# Patient Record
Sex: Female | Born: 1955 | Race: White | Hispanic: No | Marital: Married | Smoking: Never smoker
Health system: Southern US, Community
[De-identification: ages and names within clinical notes are randomized; demographics above are authoritative.]

## PROBLEM LIST (undated history)

## (undated) DIAGNOSIS — G43909 Migraine, unspecified, not intractable, without status migrainosus: Secondary | ICD-10-CM

## (undated) DIAGNOSIS — K649 Unspecified hemorrhoids: Secondary | ICD-10-CM

## (undated) HISTORY — PX: HEMORROIDECTOMY: SUR656

## (undated) HISTORY — DX: Migraine, unspecified, not intractable, without status migrainosus: G43.909

## (undated) HISTORY — DX: Unspecified hemorrhoids: K64.9

---

## 2009-10-29 ENCOUNTER — Ambulatory Visit (HOSPITAL_COMMUNITY): Admission: RE | Admit: 2009-10-29 | Discharge: 2009-10-29 | Payer: Self-pay | Admitting: Urology

## 2011-08-23 IMAGING — CT CT ABD-PEL WO/W CM
3 of 11 series · 12 of 46 positions shown, 18 images · IV contrast (Omnipaque 300)
Comparison: None.

CLINICAL DATA: Cystitis with UTI.  Hematuria since 09/28/2009.

CT ABDOMEN AND PELVIS WITHOUT AND WITH CONTRAST
TECHNIQUE: Multidetector CT imaging of the abdomen and pelvis was
performed without contrast material in one or both body regions,
followed by contrast material(s) and further sections in one or
both body regions.
Contrast: 125 ml Omnipaque 300%

[Series 3: abd_pel_wo 5.0 b40f · axial · 0.70mm/px · z∈[-396,-336]mm · 2 of 84 slices shown]
[im 12/84  soft-tissue]
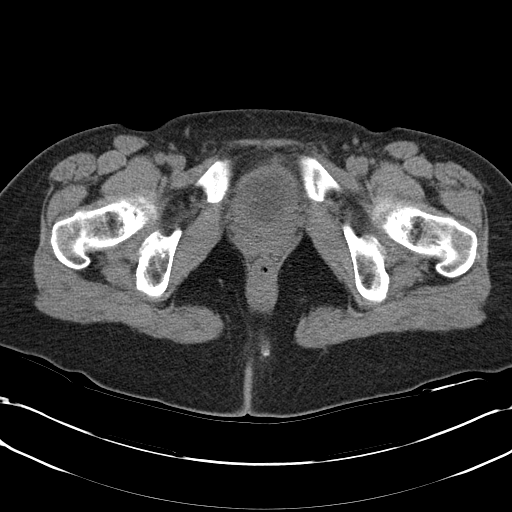
[im 24/84  soft-tissue]
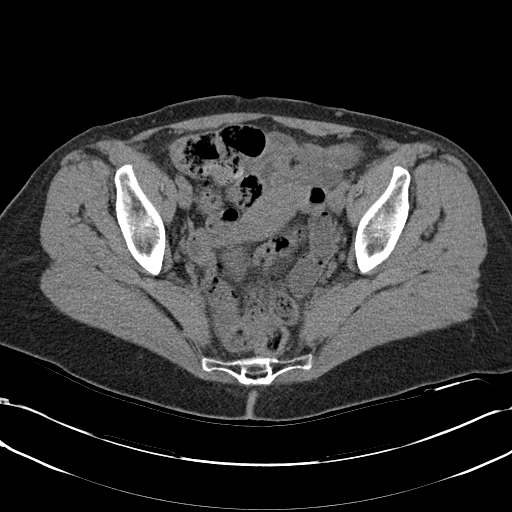

[Series 4: mpr pre contrast coronal 3.0 · coronal · non-contrast · 0.70mm/px · 2 of 71 slices shown, 3 images]
[im 24/71  soft-tissue]
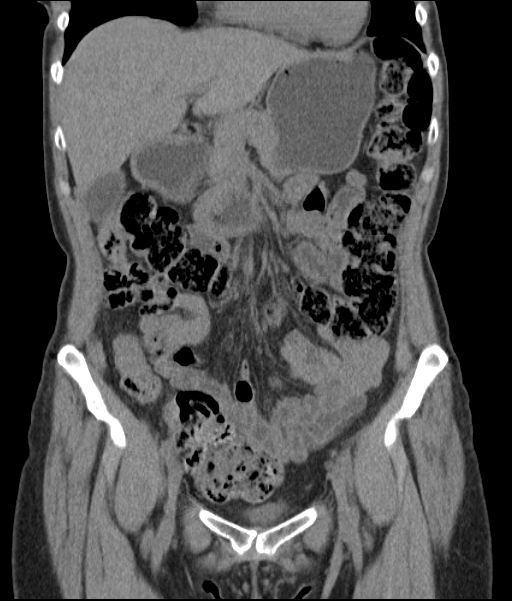
[im 24/71  bone]
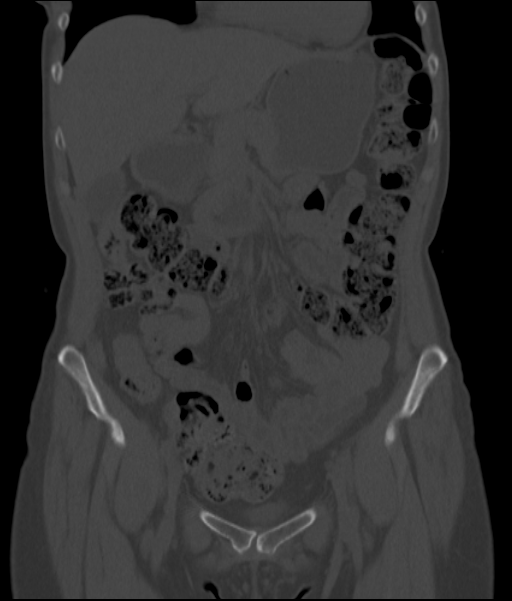
[im 47/71  soft-tissue]
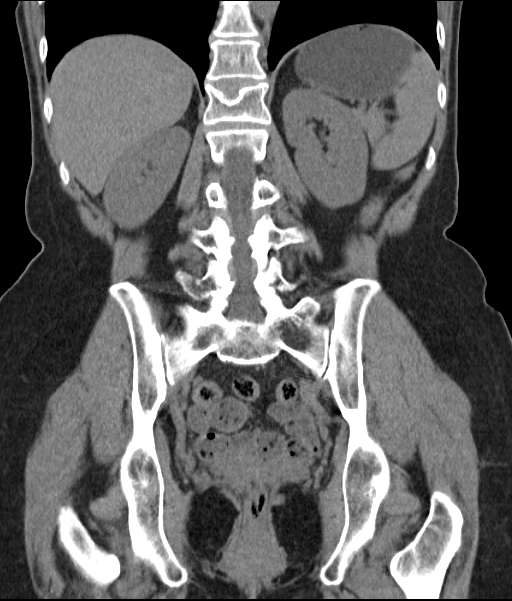

[Series 11: abd_pel 5.0 b40f · axial · 0.69mm/px · z∈[-282,+84]mm · 8 of 95 slices shown, 13 images]
[im 11/95  soft-tissue]
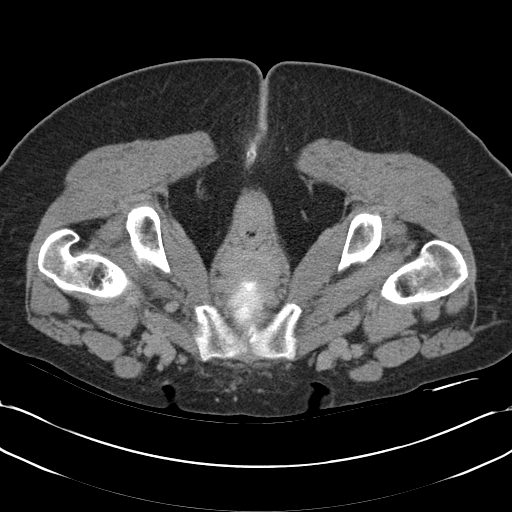
[im 11/95  bone]
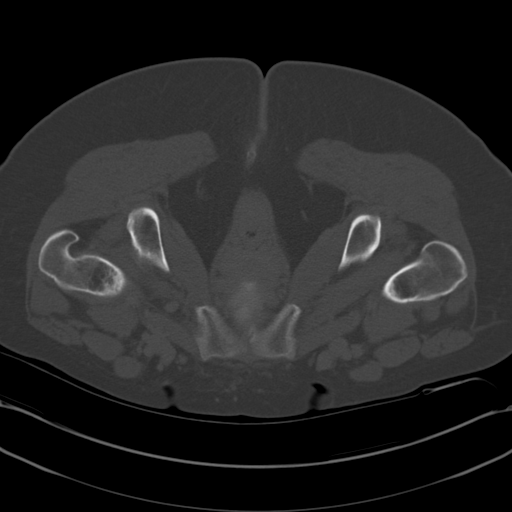
[im 21/95  soft-tissue]
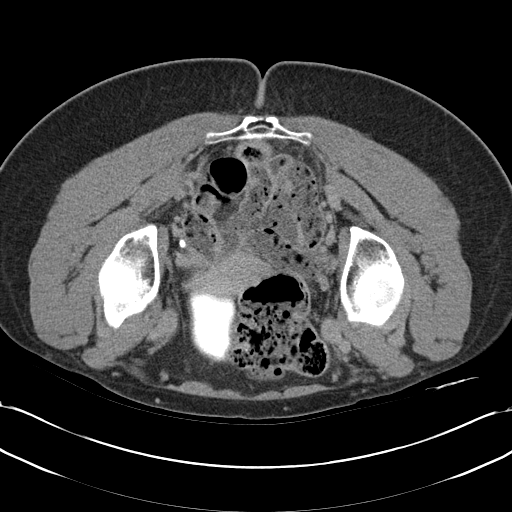
[im 32/95  soft-tissue]
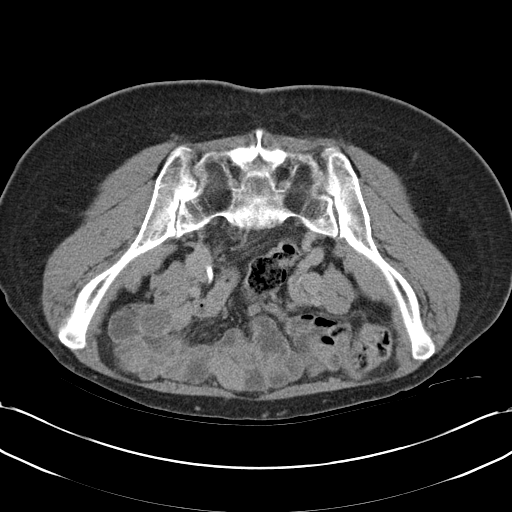
[im 42/95  soft-tissue]
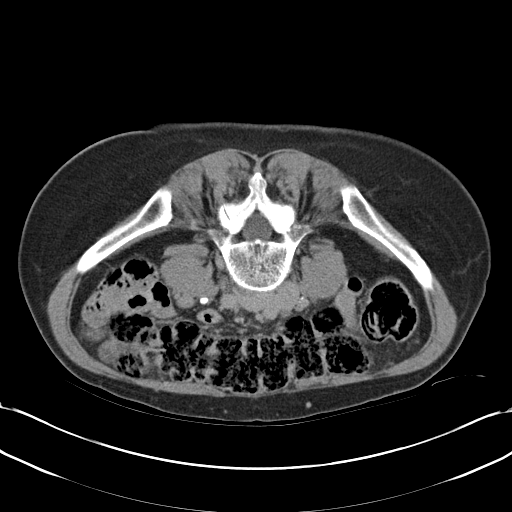
[im 53/95  soft-tissue]
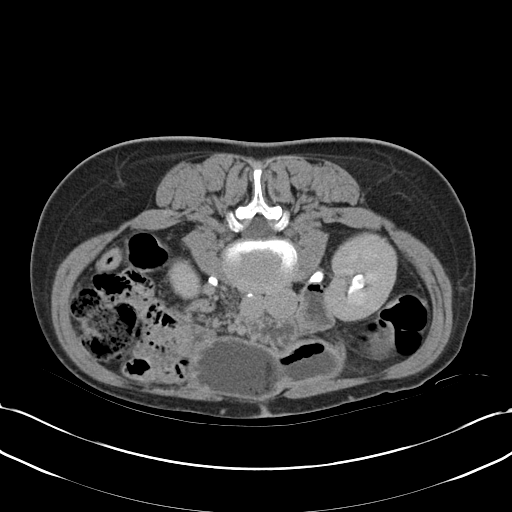
[im 53/95  lung]
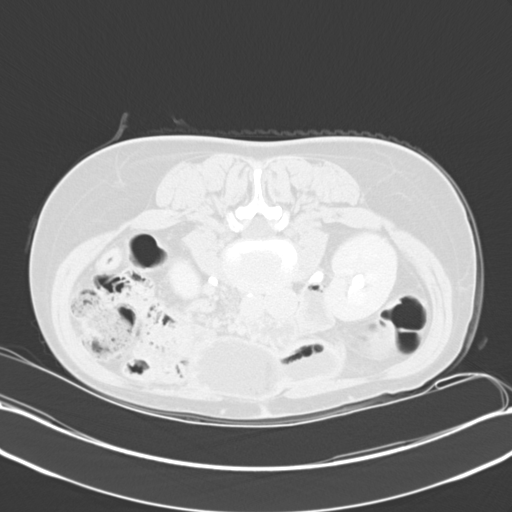
[im 63/95  soft-tissue]
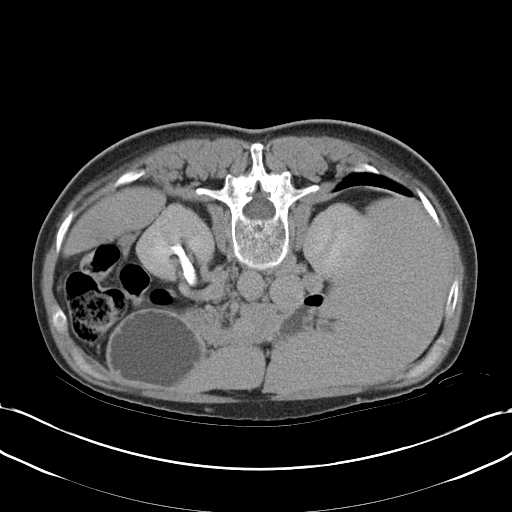
[im 63/95  lung]
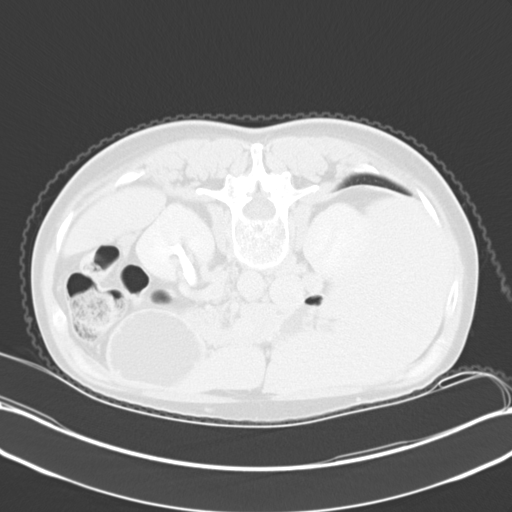
[im 74/95  soft-tissue]
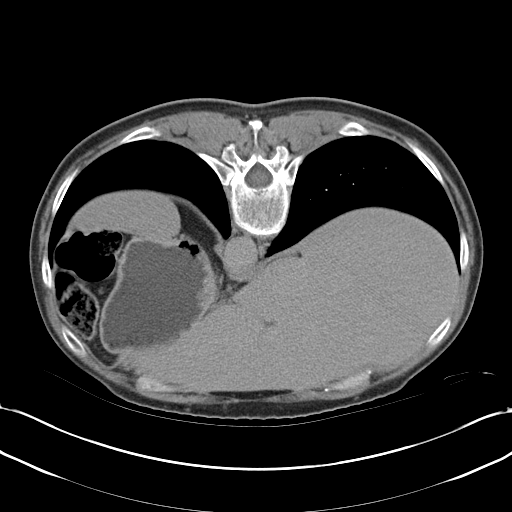
[im 74/95  lung]
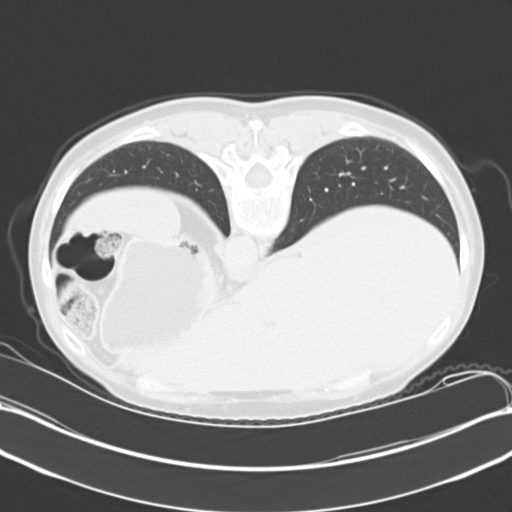
[im 84/95  soft-tissue]
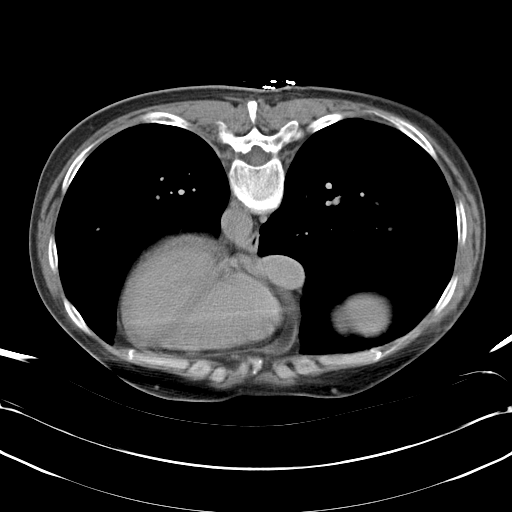
[im 84/95  lung]
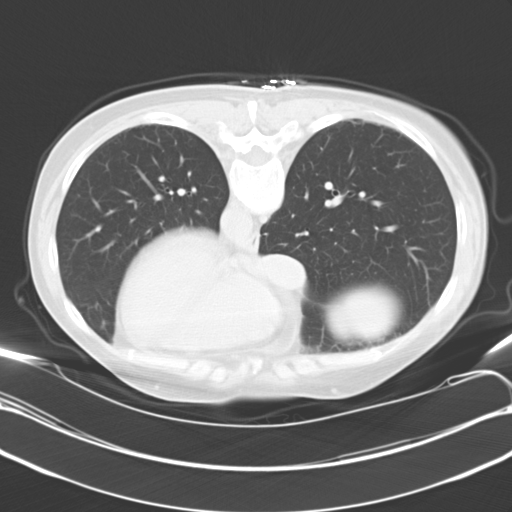

[12 of 46 positions shown; findings below may reference images not displayed]

FINDINGS: The lung bases appear clear.

On the precontrast exam, no evidence for renal, ureteral or bladder
calcifications are seen.  There are two small calcifications
identified in the region of the urethra one measuring 2 mm and the
second measuring approximately 1 mm.  Resolution is not adequate to
assess whether these are intraurethral or periurethral and these
may represent small calcifications in a periurethral diverticuli or
glands.  A small amount of calcification is identified in the
descending aorta and right iliac artery.  Bony structures
demonstrate degenerative changes with degenerative height loss and
Schmorl's node formation at the L1 vertebral body and disc
degeneration at the L5 S1 level. Mild degenerative change of the
right hip is seen.

The liver, spleen, adrenals, pancreas and gallbladder have a normal
post contrasted appearance.  The kidneys demonstrate a normal early
and delayed contrasted appearance.  No focal ureteral or bladder
abnormality is seen.  The bowel has an unremarkable non contrasted
appearance with a normal appearing appendix.

A small amount of free pelvic fluid is suggested.  A normal
postmenopausal appearance to the uterus is seen.  The ovaries are
not visualized with confidence.  No definite abnormal adenopathy is
seen. No intraabdominal or pelvic stranding is seen.  Extra
abdominal soft tissues are unremarkable.
IMPRESSION: No focal renal, ureteral or bladder abnormality noted.  Two small
calcifications noted in the region of the urethra as described
above questionably periurethral.

Bony degenerative changes as described above. Mild atherosclerotic
change.

Otherwise unremarkable CT.

## 2013-07-03 ENCOUNTER — Ambulatory Visit (INDEPENDENT_AMBULATORY_CARE_PROVIDER_SITE_OTHER): Payer: BC Managed Care – PPO | Admitting: Gastroenterology

## 2013-07-03 ENCOUNTER — Encounter (INDEPENDENT_AMBULATORY_CARE_PROVIDER_SITE_OTHER): Payer: Self-pay

## 2013-07-03 ENCOUNTER — Encounter: Payer: Self-pay | Admitting: Gastroenterology

## 2013-07-03 VITALS — BP 156/83 | HR 77 | Temp 98.4°F | Ht 69.0 in | Wt 147.2 lb

## 2013-07-03 DIAGNOSIS — K625 Hemorrhage of anus and rectum: Secondary | ICD-10-CM

## 2013-07-03 NOTE — Progress Notes (Signed)
NO PCP

## 2013-07-03 NOTE — Progress Notes (Signed)
Pt is aware of next banding and appt card was given to her

## 2013-07-03 NOTE — Patient Instructions (Signed)
FOLLOW A HIGH FIBER DIET. AVOID ITEMS THAT CAUSE BLOATING AND GAS. SEE INFO BELOW.  DRINK WATER TO KEEP YOUR URINE LIGHT YELLOW.  CONSIDER USING FIBER POWDER OR 1 PACKET ONCE DAILY FOR 3 DAYS THEN TWICE DAILY FOR 3 DAYS THEN THREE TIMES A DAY. AVOID HIGHER DOSES IF IT CAUSES BLOATING & GAS.   SIT FOR LESS THAN 5 MINUTES ON THE COMMODE.  FOLLOW UP IN JAN 28 AT 1130 AM.    High-Fiber Diet A high-fiber diet changes your normal diet to include more whole grains, legumes, fruits, and vegetables. Changes in the diet involve replacing refined carbohydrates with unrefined foods. The calorie level of the diet is essentially unchanged. The Dietary Reference Intake (recommended amount) for adult males is 38 grams per day. For adult females, it is 25 grams per day. Pregnant and lactating women should consume 28 grams of fiber per day. Fiber is the intact part of a plant that is not broken down during digestion. Functional fiber is fiber that has been isolated from the plant to provide a beneficial effect in the body. PURPOSE  Increase stool bulk.   Ease and regulate bowel movements.   Lower cholesterol.  INDICATIONS THAT YOU NEED MORE FIBER  Constipation and hemorrhoids.   Uncomplicated diverticulosis (intestine condition) and irritable bowel syndrome.   Weight management.   As a protective measure against hardening of the arteries (atherosclerosis), diabetes, and cancer.   DO NOT USE WITH:  Acute diverticulitis (intestine infection).   Partial small bowel obstructions.   Complicated diverticular disease involving bleeding, rupture (perforation), or abscess (boil, furuncle).   Presence of autonomic neuropathy (nerve damage) or gastroparesis (stomach cannot empty itself).    GUIDELINES FOR INCREASING FIBER IN THE DIET  Start adding fiber to the diet slowly. A gradual increase of about 5 more grams (2 slices of whole-wheat bread, 2 servings of most fruits or vegetables, or 1 bowl of  high-fiber cereal) per day is best. Too rapid an increase in fiber may result in constipation, flatulence, and bloating.   Drink enough water and fluids to keep your urine clear or pale yellow. Water, juice, or caffeine-free drinks are recommended. Not drinking enough fluid may cause constipation.   Eat a variety of high-fiber foods rather than one type of fiber.   Try to increase your intake of fiber through using high-fiber foods rather than fiber pills or supplements that contain small amounts of fiber.   The goal is to change the types of food eaten. Do not supplement your present diet with high-fiber foods, but replace foods in your present diet.    INCLUDE A VARIETY OF FIBER SOURCES  Replace refined and processed grains with whole grains, canned fruits with fresh fruits, and incorporate other fiber sources. White rice, white breads, and most bakery goods contain little or no fiber.   Brown whole-grain rice, buckwheat oats, and many fruits and vegetables are all good sources of fiber. These include: broccoli, Brussels sprouts, cabbage, cauliflower, beets, sweet potatoes, white potatoes (skin on), carrots, tomatoes, eggplant, squash, berries, fresh fruits, and dried fruits.   Cereals appear to be the richest source of fiber. Cereal fiber is found in whole grains and bran. Bran is the fiber-rich outer coat of cereal grain, which is largely removed in refining. In whole-grain cereals, the bran remains. In breakfast cereals, the largest amount of fiber is found in those with "bran" in their names. The fiber content is sometimes indicated on the label.   You may need  to include additional fruits and vegetables each day.   In baking, for 1 cup white flour, you may use the following substitutions:   1 cup whole-wheat flour minus 2 tablespoons.   1/2 cup white flour plus 1/2 cup whole-wheat flour.

## 2013-07-03 NOTE — Progress Notes (Signed)
   Subjective:    Patient ID: Erica Jones, female    DOB: 1955-10-12, 58 y.o.   MRN: [id number] No PCP Per Patient  HPI RECTAL BLEEDING FOR THE PAST 1 YEAR. LAST TCS > 10 YEARS. SITS A LOT RIDING IN A CAR. SHE VOLUNTEERS FOR [facility]. HAD 4 KIDS. NO ETOH , ASA, OR BC/GOODY.  Past Medical History  Diagnosis Date  . Hemorrhoids   . Migraines     Past Surgical History  Procedure Laterality Date  . Hemorroidectomy      14 years ago   No Known Allergies  Current Outpatient Prescriptions  Medication Sig Dispense Refill  . ibuprofen (ADVIL,MOTRIN) 200 MG tablet Take 200 mg by mouth every 6 (six) hours as needed.  PRN    . naproxen sodium (ANAPROX) 220 MG tablet Take 220 mg by mouth 3 (three) times daily with meals.  PRN      Family History  Problem Relation Age of Onset  . Colon cancer Neg Hx   . Colon polyps Neg Hx    History   Social History  . Marital Status: Married    Spouse Name: N/A    Number of Children: N/A  . Years of Education: N/A   Occupational History  . Not on file.   Social History Main Topics  . Smoking status: Never Smoker   . Smokeless tobacco: Not on file  . Alcohol Use: No  . Drug Use: No  . Sexual Activity: Not on file   Other Topics Concern  . Not on file   Social History Narrative   PT HAS BEEN IN [place] FOR 2 YEARS. ORIGINALLY FROM [place].              Review of Systems PER HPI OTHERWISE ALL SYSTEMS ARE NEGATIVE.     Objective:   Physical Exam  Vitals reviewed. Constitutional: She is oriented to person, place, and time. She appears well-nourished. No distress.  HENT:  Head: Normocephalic and atraumatic.  Mouth/Throat: No oropharyngeal exudate.  Eyes: Pupils are equal, round, and reactive to light. No scleral icterus.  Neck: Normal range of motion. Neck supple.  Cardiovascular: Normal rate, regular rhythm and normal heart sounds.   Pulmonary/Chest: Effort normal and breath sounds normal. No  respiratory distress.  Abdominal: Soft. Bowel sounds are normal. She exhibits no distension. There is no tenderness.  Musculoskeletal: She exhibits no edema.  VARICOSE VEINS  Lymphadenopathy:    She has no cervical adenopathy.  Neurological: She is alert and oriented to person, place, and time.  NO FOCAL DEFICITS   Psychiatric: She has a normal mood and affect.          Assessment & Plan:

## 2013-07-03 NOTE — Assessment & Plan Note (Addendum)
MOST LIKELY DUE TO HEMORRHOIDS.  DISCUSSED MANAGEMENT OPTIONS: 1. MEDICAL 2. TCS/BANDING OR 3 [facility] BANDING. PT DECLINED TCS AT THIS TIME R ANT BAND PLACED [facility] BANDING JAN 28 AT 1130. NEEDS LIDOCAINE AND NTG PRIOR TO BANDING

## 2013-07-03 NOTE — Progress Notes (Addendum)
  PROCEDURE TECHNIQUE: BENEFITS RISK EXPLAINED TO PT. EXTERNAL HEMORRHOIDS PRESENT. ANOSCOPY PERFORMED. BULGING INTERNAL HEMORRHOID COLUMN IN THE R POSTERIOR, left lateral, AND ANTERIOR COLUMNS. ONE [facility] BAND PLACED IN RIGHT ANTERIOR POSITION. POST-BANDING RECTAL EXAM REVEALED GOOD PLACEMENT. EXAM NON-TENDER.

## 2013-07-23 ENCOUNTER — Ambulatory Visit (INDEPENDENT_AMBULATORY_CARE_PROVIDER_SITE_OTHER): Payer: BC Managed Care – PPO | Admitting: Gastroenterology

## 2013-07-23 ENCOUNTER — Encounter: Payer: Self-pay | Admitting: Gastroenterology

## 2013-07-23 VITALS — BP 153/87 | HR 86 | Temp 97.6°F | Wt 145.2 lb

## 2013-07-23 DIAGNOSIS — K602 Anal fissure, unspecified: Secondary | ICD-10-CM

## 2013-07-23 DIAGNOSIS — K648 Other hemorrhoids: Secondary | ICD-10-CM

## 2013-07-23 MED ORDER — NITROGLYCERIN 0.4 % RE OINT: TOPICAL_OINTMENT | RECTAL | Status: DC

## 2013-07-23 NOTE — Progress Notes (Signed)
SYMPTOMS: RECTAL BLEEDING: sml amount 3 days after [facility] banding.  Continues with RECTAL PAIN.  SYMPTOMS: RECTAL BLEEDING, PRESSURE, PAIN, ITCHING, BURNING.  CONSTIPATION: NO DIARRHEA: NO  STRAINS WITH BMs: NO  TIME SPENT ON TOILET: 10 MINS TISSUE POKES OUT OF RECTUM: YES- GOES BACK BY ITSELF FIBER SUPPLEMENTS: YES-METAMUCIL. GLASSES OF WATER/DAY: 6-8-YES   ADDITIONAL QUESTIONS:  LATEX ALLERGY: NO PREGNANT: NO ERECTILE DYSFUNCTION MEDS OR NITRATES: NO ANTICOAGULATION/ANTIPLATELET MEDS: NO DIAGNOSED WITH CROHN'S DISEASE, PROCTITIS, PORTAL HTN, OR ANAL/RECTAL CA: NO TAKING IMMUNOSUPPRESSANTS/XRT: NO  PLAN: 1. [facility] BANDING TODAY   PROCEDURE TECHNIQUE: BENEFITS RISK EXPLAINED TO PT. ONE [facility] BAND PLACED IN RIGHT ANTERIOR POSITION. POST-BANDING RECTAL EXAM REVEALED GOOD PLACEMENT. EXAM TENDER IN ANTERIOR AND POSTERIOR MIDLINE. BAND SITE NON-TENDER.

## 2013-07-23 NOTE — Patient Instructions (Signed)
FOLLOW A HIGH FIBER DIET. AVOID ITEMS THAT CAUSE BLOATING AND GAS. SEE INFO BELOW.  DRINK WATER TO KEEP HER URINE LIGHT YELLOW.  Use NITROGLYCERIN ointment 0.4% FOUR times a day for 3 MOS.  SIT FOR LESS THAN 5 MINUTES ON THE COMMODE.  FOLLOW UP IN 4 WEEKS.     High-Fiber Diet A high-fiber diet changes your normal diet to include more whole grains, legumes, fruits, and vegetables. Changes in the diet involve replacing refined carbohydrates with unrefined foods. The calorie level of the diet is essentially unchanged. The Dietary Reference Intake (recommended amount) for adult males is 38 grams per day. For adult females, it is 25 grams per day. Pregnant and lactating women should consume 28 grams of fiber per day. Fiber is the intact part of a plant that is not broken down during digestion. Functional fiber is fiber that has been isolated from the plant to provide a beneficial effect in the body. PURPOSE  Increase stool bulk.   Ease and regulate bowel movements.   Lower cholesterol.  INDICATIONS THAT YOU NEED MORE FIBER  Constipation and hemorrhoids.   Uncomplicated diverticulosis (intestine condition) and irritable bowel syndrome.   Weight management.   As a protective measure against hardening of the arteries (atherosclerosis), diabetes, and cancer.   DO NOT USE WITH:  Acute diverticulitis (intestine infection).   Partial small bowel obstructions.   Complicated diverticular disease involving bleeding, rupture (perforation), or abscess (boil, furuncle).   Presence of autonomic neuropathy (nerve damage) or gastroparesis (stomach cannot empty itself).    GUIDELINES FOR INCREASING FIBER IN THE DIET  Start adding fiber to the diet slowly. A gradual increase of about 5 more grams (2 slices of whole-wheat bread, 2 servings of most fruits or vegetables, or 1 bowl of high-fiber cereal) per day is best. Too rapid an increase in fiber may result in constipation, flatulence, and  bloating.   Drink enough water and fluids to keep your urine clear or pale yellow. Water, juice, or caffeine-free drinks are recommended. Not drinking enough fluid may cause constipation.   Eat a variety of high-fiber foods rather than one type of fiber.   Try to increase your intake of fiber through using high-fiber foods rather than fiber pills or supplements that contain small amounts of fiber.   The goal is to change the types of food eaten. Do not supplement your present diet with high-fiber foods, but replace foods in your present diet.    INCLUDE A VARIETY OF FIBER SOURCES  Replace refined and processed grains with whole grains, canned fruits with fresh fruits, and incorporate other fiber sources. White rice, white breads, and most bakery goods contain little or no fiber.   Brown whole-grain rice, buckwheat oats, and many fruits and vegetables are all good sources of fiber. These include: broccoli, Brussels sprouts, cabbage, cauliflower, beets, sweet potatoes, white potatoes (skin on), carrots, tomatoes, eggplant, squash, berries, fresh fruits, and dried fruits.   Cereals appear to be the richest source of fiber. Cereal fiber is found in whole grains and bran. Bran is the fiber-rich outer coat of cereal grain, which is largely removed in refining. In whole-grain cereals, the bran remains. In breakfast cereals, the largest amount of fiber is found in those with "bran" in their names. The fiber content is sometimes indicated on the label.   You may need to include additional fruits and vegetables each day.   In baking, for 1 cup white flour, you may use the following substitutions:  1 cup whole-wheat flour minus 2 tablespoons.   1/2 cup white flour plus 1/2 cup whole-wheat flour.

## 2013-07-24 ENCOUNTER — Encounter: Payer: BC Managed Care – PPO | Admitting: Gastroenterology

## 2013-07-24 DIAGNOSIS — K648 Other hemorrhoids: Secondary | ICD-10-CM | POA: Insufficient documentation

## 2013-07-24 DIAGNOSIS — K602 Anal fissure, unspecified: Secondary | ICD-10-CM | POA: Insufficient documentation

## 2013-07-24 NOTE — Assessment & Plan Note (Signed)
LIKELY CONTRIBUTING TO RECTAL PAIN AND BLEEDING.  FOLLOW A HIGH FIBER DIET. AVOID ITEMS THAT CAUSE BLOATING AND GAS.  DRINK WATER TO KEEP URINE LIGHT YELLOW. Use NITROGLYCERIN ointment 0.4% FOUR times a day for 3 MOS. SIT FOR LESS THAN 5 MINUTES ON THE COMMODE.  FOLLOW UP IN 4 WEEKS.

## 2013-07-24 NOTE — Assessment & Plan Note (Addendum)
PT HAD SIGNIFICANT DISCOMFORT ON RECTAL EXAM  AFTER LIDOCIANE AND NTG OINTMENT APPLIED.  FOLLOW A HIGH FIBER DIET. AVOID ITEMS THAT CAUSE BLOATING AND GAS.  DRINK WATER TO KEEP URINE LIGHT YELLOW. SIT FOR LESS THAN 5 MINUTES ON THE COMMODE.  DISCUSSED MANAGEMENT OPTIONS: 1. BANDING VIA ENDOSCOPY OR CONTINUED [facility] BANDING. PT ELECTED TO FOLLOW UP IN 4 WEEKS FOR L LAT BAND.

## 2013-08-01 ENCOUNTER — Telehealth: Payer: Self-pay | Admitting: Gastroenterology

## 2013-08-01 NOTE — Telephone Encounter (Signed)
I called pt and told her I will check with Dr. Darrick PennaFields and see what we can send in.

## 2013-08-01 NOTE — Telephone Encounter (Signed)
PLEASE CALL PT. SHE HAD A GREAT DEAL OF DISCOMFORT DURING LAST BAND PLACEMENT. IT IS MOST LIKELY DUE TO AN ANAL FISSURE. HER PAIN AND DISCOMFORT WILL CONTINUE IF SHE DOES NOT USE NTG OINTMENT. UNFORTUNATELY THERE ARE NO CHEAPER ALTERNATIVES.   WE WILL CALL [facility] TO SEE IF A COMPOUNDED DOSE WOULD BE CHEAPER: NTG OINTMENT 0.125% 30 G.

## 2013-08-01 NOTE — Telephone Encounter (Signed)
Pt seen recently and SF had prescribed her nitroglycerin ointment. Patient said that it was going to cost her $410 to get and was this something that she really needed. Please advise. [phone]

## 2013-08-01 NOTE — Telephone Encounter (Signed)
[facility]. Instructions : apply a pea sized amt internally two to three times a day. No refills. Per [facility], it will cost the pt about $45.00. Pt is aware.

## 2013-08-21 ENCOUNTER — Ambulatory Visit (INDEPENDENT_AMBULATORY_CARE_PROVIDER_SITE_OTHER): Payer: BC Managed Care – PPO | Admitting: Gastroenterology

## 2013-08-21 ENCOUNTER — Encounter: Payer: Self-pay | Admitting: Gastroenterology

## 2013-08-21 ENCOUNTER — Encounter (INDEPENDENT_AMBULATORY_CARE_PROVIDER_SITE_OTHER): Payer: Self-pay

## 2013-08-21 VITALS — BP 145/83 | HR 80 | Temp 98.4°F | Wt 143.0 lb

## 2013-08-21 DIAGNOSIS — K602 Anal fissure, unspecified: Secondary | ICD-10-CM

## 2013-08-21 DIAGNOSIS — K648 Other hemorrhoids: Secondary | ICD-10-CM

## 2013-08-21 NOTE — Progress Notes (Signed)
   Subjective:    Patient ID: Janace Arisamela Ceesay, female    DOB: 02-20-1956, 58 y.o.   MRN: [id number] No PCP Per Patient\ HPI    Review of Systems     Objective:   Physical Exam  Vitals reviewed. Constitutional: She is oriented to person, place, and time. She appears well-developed and well-nourished. No distress.  HENT:  Head: Normocephalic and atraumatic.  Eyes: Pupils are equal, round, and reactive to light. No scleral icterus.  Cardiovascular: Normal rate, regular rhythm and normal heart sounds.   Pulmonary/Chest: Effort normal and breath sounds normal. No respiratory distress.  Abdominal: Soft. Bowel sounds are normal. She exhibits no distension. There is no tenderness.  Musculoskeletal: She exhibits no edema.  Neurological: She is alert and oriented to person, place, and time.  NO FOCAL DEFICITS           Assessment & Plan:

## 2013-08-21 NOTE — Progress Notes (Signed)
SYMPTOMS: RECTAL BLEEDING STILL AFTER BMs BETTER BUT NOT RESOLVED. JUST STARTED NTG OINTMENT 2 WEEKS AGO. USING TID., PRESSURE, burning slit at anus NO ITCHING. RARE SOILING. AFTER A BM MAY HAVE SOILING.    CONSTIPATION: NO DIARRHEA: NO  STRAINS WITH BMs: NO  TIME SPENT ON TOILET: 5 MINS TISSUE POKES OUT OF RECTUM: NO FIBER SUPPLEMENTS: YES GLASSES OF WATER/DAY: 6-8: YES   ADDITIONAL QUESTIONS:  LATEX ALLERGY: NO PREGNANT: NO ERECTILE DYSFUNCTION MEDS OR NITRATES: NO ANTICOAGULATION/ANTIPLATELET MEDS: NO DIAGNOSED WITH CROHN'S DISEASE, PROCTITIS, PORTAL HTN, OR ANAL/RECTAL CA: NO TAKING IMMUNOSUPPRESSANTS/XRT: NO  PLAN: 1. L LAT BAND TODAY  PROCEDURE TECHNIQUE: BENEFITS RISK EXPLAINED TO PT. NTG/LIDOCAINE APPLIED TO ANUS. ONE [facility] BAND PLACED IN LEFT LATERAL POSITION. POST-BANDING RECTAL EXAM REVEALED GOOD PLACEMENT. EXAM NON-TENDER.

## 2013-08-21 NOTE — Progress Notes (Signed)
No pcp

## 2013-08-21 NOTE — Assessment & Plan Note (Addendum)
SX IMPROVED BUT NOTRESOLVED.  L LAT BAND TODAY OPV IN 6 WEEKS TO ASSESS NEED FOR ANOSCOPY AND [facility] BANDING. WILL NEED NTG/LIDOCAINE PRIOR TO BAND.

## 2013-08-21 NOTE — Assessment & Plan Note (Signed)
SX IMPROVED BUT NOT RESOLVED.  CONTINUE NTG OINTMENT. OPV IN 6 WEEKS

## 2013-08-21 NOTE — Patient Instructions (Signed)
FOLLOW A HIGH FIBER DIET. AVOID ITEMS THAT CAUSE BLOATING AND GAS.   DRINK WATER TO KEEP HER URINE LIGHT YELLOW.   Use NITROGLYCERIN ointment 0.4% FOUR times a day for 3 MOS.   FOLLOW UP IN 6 WEEKS. WE WILL TALK ABOUT YOUR SYMPTOMS AND DECIDE IF YOU NEED ANOTHER BAND.

## 2013-08-23 NOTE — Progress Notes (Signed)
[facility]

## 2013-09-17 ENCOUNTER — Telehealth: Payer: Self-pay

## 2013-09-17 NOTE — Telephone Encounter (Signed)
Pt left a Vm that she needs a refill of her nitroglycerin ointment sent to [facility].

## 2013-09-18 MED ORDER — NITROGLYCERIN 0.4 % RE OINT: TOPICAL_OINTMENT | RECTAL | Status: DC

## 2013-09-18 NOTE — Telephone Encounter (Signed)
Please call pt when her RX is faxed in [phone], pt states her pharmacy does not have it

## 2013-09-18 NOTE — Telephone Encounter (Signed)
PLEASE CALL PT. RX SENT. 

## 2013-09-19 NOTE — Telephone Encounter (Signed)
Pt was aware that Rx was sent in. She questioned why the first was 0.125 % and the last was 0.4 %. She is concerned about it being stronger. She is aware it is for pain but she wants to know if it helps heal from the inside out?  Please advise!

## 2013-09-20 NOTE — Telephone Encounter (Signed)
Called patient TO DISCUSS CONCERNS. LVM-MAY USE NTG OINTMENT AT 0.4. MED SIDE EFFECTS DISCUSSED. I CALLED [facility] First prescription was for 0.125%. REFILL FOR 0.4%.

## 2013-09-23 NOTE — Telephone Encounter (Signed)
SPOKE TO PT. SX NOT SIGNIFICANTLY IMPROVED WITH 0.125%,. INCREASE TO 0.4%. MED SIDE EFFECTS DISCUSSED: HA, DIZZINESS, SYNCOPE. PT VOICED HER UNDERSTANDING.

## 2013-10-09 ENCOUNTER — Ambulatory Visit: Payer: BC Managed Care – PPO | Admitting: Gastroenterology

## 2013-10-16 ENCOUNTER — Ambulatory Visit: Payer: BC Managed Care – PPO | Admitting: Gastroenterology

## 2013-10-23 ENCOUNTER — Encounter (HOSPITAL_COMMUNITY): Payer: Self-pay | Admitting: Pharmacy Technician

## 2013-10-23 ENCOUNTER — Other Ambulatory Visit: Payer: Self-pay | Admitting: Gastroenterology

## 2013-10-23 ENCOUNTER — Ambulatory Visit (INDEPENDENT_AMBULATORY_CARE_PROVIDER_SITE_OTHER): Payer: BC Managed Care – PPO | Admitting: Gastroenterology

## 2013-10-23 ENCOUNTER — Encounter: Payer: Self-pay | Admitting: Gastroenterology

## 2013-10-23 VITALS — BP 144/78 | HR 88 | Temp 97.4°F | Ht 70.0 in | Wt 140.0 lb

## 2013-10-23 DIAGNOSIS — K625 Hemorrhage of anus and rectum: Secondary | ICD-10-CM

## 2013-10-23 DIAGNOSIS — K602 Anal fissure, unspecified: Secondary | ICD-10-CM

## 2013-10-23 DIAGNOSIS — K6289 Other specified diseases of anus and rectum: Secondary | ICD-10-CM

## 2013-10-23 NOTE — Progress Notes (Signed)
   Subjective:    Patient ID: Erica Jones, female    DOB: 06/15/1956, 58 y.o.   MRN: [id number]  No PCP Per Patient  HPI BOTTOM: PAIN BETTER BUT STILL SORE ON THE OUTSIDE. THE BLEEDING CAN BE HEAVY: SAT: A THIN 4. THE STOOL IS OK. SOUNDS LIKE IT'S POURING OUT. STILL USING OINTMENT QID. BLEEDING IS THE SAME. NTG OINTMENT MAKES HER FEEL BAD. DOESN'T FEEL IT'S WORKING AFTER 3 MOS AND INCREASING THE DOSE.   Past Medical History  Diagnosis Date  . Hemorrhoids   . Migraines    Past Surgical History  Procedure Laterality Date  . Hemorroidectomy      14 years ago   No Known Allergies  Current Outpatient Prescriptions  Medication Sig Dispense Refill  . ibuprofen (ADVIL,MOTRIN) 200 MG tablet Take 200 mg by mouth every 6 (six) hours as needed.      . naproxen sodium (ANAPROX) 220 MG tablet Take 220 mg by mouth 3 (three) times daily with meals.      . Nitroglycerin 0.4 % OINT PLACE A SMALL AMOUNT ON Q TIP AND INSERT IN ANAL CANAL QID FOR 3 MOS.       Review of Systems     Objective:   Physical Exam  Vitals reviewed. Constitutional: She is oriented to person, place, and time. She appears well-nourished. No distress.  HENT:  Head: Normocephalic and atraumatic.  Mouth/Throat: Oropharynx is clear and moist. No oropharyngeal exudate.  Eyes: Pupils are equal, round, and reactive to light. No scleral icterus.  Neck: Normal range of motion. Neck supple.  Cardiovascular: Normal rate, regular rhythm and normal heart sounds.   Pulmonary/Chest: Effort normal and breath sounds normal. No respiratory distress.  Abdominal: Soft. Bowel sounds are normal. She exhibits no distension. There is no tenderness.  Musculoskeletal: She exhibits no edema.  Lymphadenopathy:    She has no cervical adenopathy.  Neurological: She is alert and oriented to person, place, and time.  NO FOCAL DEFICITS   Psychiatric:  SLIGHTLY ANXIOUS MOOD, NL AFFECT           Assessment & Plan:

## 2013-10-23 NOTE — Patient Instructions (Addendum)
  FLEXIBLE SIGMOIDSCOPY WITH POSSIBLE HEMORRHOID BANDING MON MAY 4. YOU MAY HAVE A FULL LIQUID BREAKFAST. SEE INFO BELOW. NOTHING TO EAT OR DRINK AFTER 9 AM MAY 4.  STOP USING NITRO-GLYCERIN OINTMENT.  FOLLOW UP IN 3 WEEKS.  Full Liquid Diet A high-calorie, high-protein supplement should be used to meet your nutritional requirements when the full liquid diet is continued for more than 2 or 3 days. If this diet is to be used for an extended period of time (more than 7 days), a multivitamin should be considered.  Breads and Starches  Allowed: None are allowed except crackers WHOLE OR pureed (made into a thick, smooth soup) in soup. Cooked, refined corn, oat, rice, rye, and wheat cereals are also allowed.   Avoid: Any others.    Potatoes/Pasta/Rice  Allowed: ANY ITEM AS A SOUP OR SMALL PLATE OF MASHED POTATOES OR RICE.       Vegetables  Allowed: Strained tomato or vegetable juice. Vegetables pureed in soup.   Avoid: Any others.    Fruit  Allowed: Any strained fruit juices and fruit drinks. Include 1 serving of citrus or vitamin C-enriched fruit juice daily.   Avoid: Any others.  Meat and Meat Substitutes  Allowed: Egg  Avoid: Any meat, fish, or fowl. All cheese.  Milk  Allowed: Milk beverages, including milk shakes and instant breakfast mixes. Smooth yogurt.   Avoid: Any others. Avoid dairy products if not tolerated.    Soups and Combination Foods  Allowed: Broth, strained cream soups. Strained, broth-based soups.   Avoid: Any others.    Desserts and Sweets  Allowed: flavored gelatin, tapioca, plain ice cream, sherbet, smooth pudding, junket, fruit ices, frozen ice pops, pudding pops,, frozen fudge pops, chocolate syrup. Sugar, honey, jelly, syrup.   Avoid: Any others.  Fats and Oils  Allowed: Margarine, butter, cream, sour cream, oils.   Avoid: Any others.  Beverages  Allowed: All.   Avoid: None.  Condiments  Allowed: Iodized salt, pepper, spices,  flavorings. Cocoa powder.   Avoid: Any others.    SAMPLE MEAL PLAN Breakfast   cup orange juice.   1 cup cooked wheat cereal.   1 cup  milk.   1 cup beverage (coffee or tea).   Cream or sugar, if desired.    Midmorning Snack  2 SCRAMBLED OR HARD BOILED EGG   Lunch  1 cup cream soup.    cup fruit juice.   1 cup milk.    cup custard.   1 cup beverage (coffee or tea).   Cream or sugar, if desired.    Midafternoon Snack  1 cup milk shake.  Dinner  1 cup cream soup.    cup fruit juice.   1 cup milk.    cup pudding.   1 cup beverage (coffee or tea).   Cream or sugar, if desired.  Evening Snack  1 cup supplement.  To increase calories, add sugar, cream, butter, or margarine if possible. Nutritional supplements will also increase the total calories.

## 2013-10-23 NOTE — Assessment & Plan Note (Addendum)
SX NOT IMPROVED. DIFFERENTIAL DIAGNOSIS INCLUDES CONTINUED RECTAL BLEEDING FROM HEMORRHOIDS, LESS LIKELY RECTAL CA.  DISCUSSED OPTIONS, BENEFITS V. RISKS OF MANAGING RECTAL BLEEDING/PAIN: 1. TCS, 2. FLEX SIG, OR SURGERY REFERRAL. PT DECLINED TCS. WOULD LIKE TO LEAVE SURGERY AS THE LAST OPTION. FLEX SIG MON WITH POSSIBLE HEMORRHOID BANDING STOP USING NTG OINTMENT OPV IN 3 WEEKS

## 2013-10-28 ENCOUNTER — Ambulatory Visit (HOSPITAL_COMMUNITY)
Admission: RE | Admit: 2013-10-28 | Discharge: 2013-10-28 | Disposition: A | Discharge status: Home / Self Care | Payer: BC Managed Care – PPO | Source: Ambulatory Visit | Attending: Gastroenterology | Admitting: Gastroenterology

## 2013-10-28 ENCOUNTER — Encounter (HOSPITAL_COMMUNITY): Payer: Self-pay | Admitting: *Deleted

## 2013-10-28 ENCOUNTER — Encounter (HOSPITAL_COMMUNITY)
Admission: RE | Disposition: A | Discharge status: Home / Self Care | Payer: Self-pay | Source: Ambulatory Visit | Attending: Gastroenterology

## 2013-10-28 DIAGNOSIS — K6289 Other specified diseases of anus and rectum: Secondary | ICD-10-CM

## 2013-10-28 DIAGNOSIS — K5730 Diverticulosis of large intestine without perforation or abscess without bleeding: Secondary | ICD-10-CM | POA: Insufficient documentation

## 2013-10-28 DIAGNOSIS — K625 Hemorrhage of anus and rectum: Secondary | ICD-10-CM

## 2013-10-28 DIAGNOSIS — K922 Gastrointestinal hemorrhage, unspecified: Secondary | ICD-10-CM | POA: Insufficient documentation

## 2013-10-28 DIAGNOSIS — K648 Other hemorrhoids: Secondary | ICD-10-CM

## 2013-10-28 HISTORY — PX: HEMORRHOID BANDING: SHX5850

## 2013-10-28 HISTORY — PX: FLEXIBLE SIGMOIDOSCOPY: SHX5431

## 2013-10-28 SURGERY — SIGMOIDOSCOPY, FLEXIBLE
Anesthesia: Moderate Sedation

## 2013-10-28 MED ORDER — MIDAZOLAM HCL 5 MG/5ML IJ SOLN
INTRAMUSCULAR | Status: DC | PRN
  Administered 2013-10-28 (×3): 2 mg via INTRAVENOUS

## 2013-10-28 MED ORDER — SIMETHICONE 40 MG/0.6ML PO SUSP
ORAL | Status: DC | PRN
  Administered 2013-10-28: 14:00:00

## 2013-10-28 MED ORDER — LIDOCAINE HCL 2 % EX GEL
Freq: Once | CUTANEOUS | Status: AC
  Administered 2013-10-28: 1 via TOPICAL
  Filled 2013-10-28: qty 5

## 2013-10-28 MED ORDER — MEPERIDINE HCL 100 MG/ML IJ SOLN
INTRAMUSCULAR | Status: AC
  Filled 2013-10-28: qty 2

## 2013-10-28 MED ORDER — SODIUM CHLORIDE 0.9 % IJ SOLN
INTRAMUSCULAR | Status: AC
  Filled 2013-10-28: qty 10

## 2013-10-28 MED ORDER — SODIUM CHLORIDE 0.9 % IV SOLN
INTRAVENOUS | Status: DC
  Administered 2013-10-28: 14:00:00 via INTRAVENOUS

## 2013-10-28 MED ORDER — PROMETHAZINE HCL 25 MG/ML IJ SOLN
INTRAMUSCULAR | Status: AC
  Filled 2013-10-28: qty 1

## 2013-10-28 MED ORDER — MIDAZOLAM HCL 5 MG/5ML IJ SOLN
INTRAMUSCULAR | Status: AC
  Filled 2013-10-28: qty 10

## 2013-10-28 MED ORDER — MEPERIDINE HCL 100 MG/ML IJ SOLN
INTRAMUSCULAR | Status: DC | PRN
  Administered 2013-10-28 (×3): 25 mg via INTRAVENOUS

## 2013-10-28 MED ORDER — LIDOCAINE HCL 2 % EX GEL
CUTANEOUS | Status: AC
  Filled 2013-10-28: qty 30

## 2013-10-28 MED ORDER — PROMETHAZINE HCL 25 MG/ML IJ SOLN
INTRAMUSCULAR | Status: DC | PRN
  Administered 2013-10-28: 6.25 mg via INTRAVENOUS

## 2013-10-28 MED ORDER — PROMETHAZINE HCL 25 MG/ML IJ SOLN
6.2500 mg | Freq: Once | INTRAMUSCULAR | Status: AC
  Administered 2013-10-28: 6.25 mg via INTRAVENOUS

## 2013-10-28 NOTE — Op Note (Signed)
[facility] [address] [place], [id number]   FLEX SIGMOIDOSCOPY PROCEDURE REPORT  PATIENT: Erica Jones, Erica R.  MR#: [id number] BIRTHDATE: Apr 18, 1956 , 58  yrs. old GENDER: Female ENDOSCOPIST: Jonette EvaSandi Kayven Aldaco, MD REFERRED BY: PROCEDURE DATE:  10/28/2013 PROCEDURE:   Sigmoidoscopy, screening and Hemorrhoidectomy via banding(3) INDICATIONS:rectal bleeding. MEDICATIONS: Demerol 75 mg IV, Versed 6 mg IV, and Promethazine (Phenergan) 12.5mg  IV  DESCRIPTION OF PROCEDURE:    Physical exam was performed.  Informed consent was obtained from the patient after explaining the benefits, risks, and alternatives to procedure.  The patient was connected to monitor and placed in left lateral position. Continuous oxygen was provided by nasal cannula and IV medicine administered through an indwelling cannula.  After administration of sedation and rectal exam, the patients rectum was intubated and the EG-[id number] ([id number]  colonoscope was advanced under direct visualization to the DESCENDING COLON  The scope was removed slowly by carefully examining the color, texture, anatomy, and integrity mucosa on the way out.  The patient was recovered in endoscopy and discharged home in satisfactory condition.    COLON FINDINGS: Mild diverticulosis was noted in the sigmoid colon. PRIOR BANDING SITES NOTED IN THE RECTUM. , The colon mucosa was otherwise normal.  Large internal hemorrhoids were found.    3 BANDS APPLIED.  PREP QUALITY: adequate      COMPLICATIONS: None  ENDOSCOPIC IMPRESSION: 1.   Mild diverticulosis was noted in the sigmoid colon 2.   The colon mucosa was otherwise normal 3.   Large internal hemorrhoids  RECOMMENDATIONS: 1.  YOU may use ibuprofen 200 MG over-the-counter 2 every 6 hours as needed for mild rectal pain OR TYLENOL.   Take the ibuprofen with food and milk. 2  Use sitz bath 3 times a day AS NEEDED FOR RECTAL PAIN. 3.  Avoid straining to have bowel  movements. 4.  Keep anal area dry and clean. 5.  Do not use a donut shaped pillow or sit on the toilet for long periods.  This increases blood pooling and pain. 6.  Move your bowels when your body has the urge; this will require less straining and will decrease pain and pressure. 7.  IF NEEDED, add Colace 100 mg twice daily FOR 7 DAYS to soften the stool.  HOLD FOR DIARRHEA. 8.  FOLLOW A LOW RESIDUE DIET FOR 2 WEEKS.  SEE INFO BELOW. 9.  USE NITROGLYCERIN AS NEEDED TO RELIEF RECTAL PRESSURE/THROBBING. 10. FLEX SIG IN 5 YEARS FOR COLON CANCER SCREENING       _______________________________ Rosalie DoctoreSignedJonette Eva:  Joleen Stuckert, MD 10/28/2013 3:21 PM     PATIENT NAME:  Erica Jones, Erica R. MR#: [id number]

## 2013-10-28 NOTE — H&P (View-Only) (Signed)
   Subjective:    Patient ID: Erica Jones, female    DOB: 07/18/1955, 58 y.o.   MRN: [id number]  No PCP Per Patient  HPI BOTTOM: PAIN BETTER BUT STILL SORE ON THE OUTSIDE. THE BLEEDING CAN BE HEAVY: SAT: A THIN 4. THE STOOL IS OK. SOUNDS LIKE IT'S POURING OUT. STILL USING OINTMENT QID. BLEEDING IS THE SAME. NTG OINTMENT MAKES HER FEEL BAD. DOESN'T FEEL IT'S WORKING AFTER 3 MOS AND INCREASING THE DOSE.   Past Medical History  Diagnosis Date  . Hemorrhoids   . Migraines    Past Surgical History  Procedure Laterality Date  . Hemorroidectomy      14 years ago   No Known Allergies  Current Outpatient Prescriptions  Medication Sig Dispense Refill  . ibuprofen (ADVIL,MOTRIN) 200 MG tablet Take 200 mg by mouth every 6 (six) hours as needed.      . naproxen sodium (ANAPROX) 220 MG tablet Take 220 mg by mouth 3 (three) times daily with meals.      . Nitroglycerin 0.4 % OINT PLACE A SMALL AMOUNT ON Q TIP AND INSERT IN ANAL CANAL QID FOR 3 MOS.       Review of Systems     Objective:   Physical Exam  Vitals reviewed. Constitutional: She is oriented to person, place, and time. She appears well-nourished. No distress.  HENT:  Head: Normocephalic and atraumatic.  Mouth/Throat: Oropharynx is clear and moist. No oropharyngeal exudate.  Eyes: Pupils are equal, round, and reactive to light. No scleral icterus.  Neck: Normal range of motion. Neck supple.  Cardiovascular: Normal rate, regular rhythm and normal heart sounds.   Pulmonary/Chest: Effort normal and breath sounds normal. No respiratory distress.  Abdominal: Soft. Bowel sounds are normal. She exhibits no distension. There is no tenderness.  Musculoskeletal: She exhibits no edema.  Lymphadenopathy:    She has no cervical adenopathy.  Neurological: She is alert and oriented to person, place, and time.  NO FOCAL DEFICITS   Psychiatric:  SLIGHTLY ANXIOUS MOOD, NL AFFECT           Assessment & Plan:

## 2013-10-28 NOTE — Interval H&P Note (Signed)
History and Physical Interval Note:  10/28/2013 2:10 PM  Erica Jones  has presented today for surgery, with the diagnosis of RECTAL BLEEDING AND RECTAL PAIN  The various methods of treatment have been discussed with the patient and family. After consideration of risks, benefits and other options for treatment, the patient has consented to  Procedure(s) with comments: FLEXIBLE SIGMOIDOSCOPY (N/A) - 2:30 HEMORRHOID BANDING (N/A) as a surgical intervention .  The patient's history has been reviewed, patient examined, no change in status, stable for surgery.  I have reviewed the patient's chart and labs.  Questions were answered to the patient's satisfaction.     Sandi L Fields

## 2013-10-28 NOTE — Discharge Instructions (Signed)
HEMORRHOIDAL BANDING DISCHARGE INSTRUCTIONS  I PUT BANDS IN 3 AREAS ABOVE YOUR HEMORRHOIDS. CALL [phone] FOR FOR RECTAL BLEEDING, FEVER, PAIN, OR DIFFICULTY URINATING. YOU MAY SEE BLEEDING FOR 3 DAYS TO 2 WEEKS.    HOME CARE INSTRUCTIONS  FOLLOWING YOUR PROCEDURE IT IS COMMON TO HAVE MINOR RECTAL DISCOMFORT OR PAIN. 1.  YOU may use ibuprofen 200 MG over-the-counter 2 every 6 hours as needed for mild rectal pain OR TYLENOL.   Take the ibuprofen with food and milk.  2   Use sitz bath 3 times a day AS NEEDED FOR RECTAL PAIN.  3. Avoid straining to have bowel movements. 4. Keep anal area dry and clean.  5. Do not use a donut shaped pillow or sit on the toilet for long periods. This increases blood pooling and pain.  6. Move your bowels when your body has the urge; this will require less straining and will decrease pain and pressure.  7. IF NEEDED, add Colace 100 mg twice daily FOR 7 DAYS to soften the stool. HOLD FOR DIARRHEA. 8. FOLLOW A LOW RESIDUE DIET FOR 2 WEEKS. SEE INFO BELOW. 9. USE NITROGLYCERIN ointment or LIDOCAINE 2% AS NEEDED TO RELIEF RECTAL PRESSURE/THROBBING. 10. FLEX SIG IN 5 YEARS FOR COLON CANCER SCREENING.      SIGMOIDOSCOPY Care After Read the instructions outlined below and refer to this sheet in the next week. These discharge instructions provide you with general information on caring for yourself after you leave the hospital. While your treatment has been planned according to the most current medical practices available, unavoidable complications occasionally occur. If you have any problems or questions after discharge, call DR. Jairus Tonne, [phone].  ACTIVITY  You may resume your regular activity, but move at a slower pace for the next 24 hours.   Take frequent rest periods for the next 24 hours.   Walking will help get rid of the air and reduce the bloated feeling in your belly (abdomen).   No driving for 24 hours (because of the medicine (anesthesia)  used during the test).   You may shower.   Do not sign any important legal documents or operate any machinery for 24 hours (because of the anesthesia used during the test).    NUTRITION  Drink plenty of fluids.   You may resume your normal diet as instructed by your doctor.   Begin with a light meal and progress to your normal diet. Heavy or fried foods are harder to digest and may make you feel sick to your stomach (nauseated).   Avoid alcoholic beverages for 24 hours or as instructed.    MEDICATIONS  You may resume your normal medications.   WHAT YOU CAN EXPECT TODAY  Some feelings of bloating in the abdomen.   Passage of more gas than usual.   Spotting of blood in your stool or on the toilet paper  .  IF YOU HAD POLYPS REMOVED DURING THE COLONOSCOPY:  Eat a soft diet IF YOU HAVE NAUSEA, BLOATING, ABDOMINAL PAIN, OR VOMITING.    FINDING OUT THE RESULTS OF YOUR TEST Not all test results are available during your visit. DR. Oneida Alar WILL CALL YOU WITHIN 7 DAYS OF YOUR PROCEDUE WITH YOUR RESULTS. Do not assume everything is normal if you have not heard from DR. Akelia Husted IN ONE WEEK, CALL HER OFFICE AT [phone].  SEEK IMMEDIATE MEDICAL ATTENTION AND CALL THE OFFICE: [phone] IF:  You have more than a spotting of blood in your stool.   Your belly  is swollen (abdominal distention).   You are nauseated or vomiting.   You have a temperature over 101F.   You have abdominal pain or discomfort that is severe or gets worse throughout the day.    HEMORRHOIDAL BANDING COMPLICATIONS:  COMMON: 1. MINOR PAIN  UNCOMMON: 1. ABSCESS 2. BAND FALLS OFF 3. PROLAPSE OF HEMORRHOIDS AND PAIN 4. ULCER BLEEDING  A. USUALLY SELF-LIMITED: MAY LAST 3-5 DAYS  B. MAY REQUIRE INTERVENTION: 1-2 WEEKS AFTER INTERACTIONS 5. NECROTIZING PELVIC SEPSIS  A. SYMPTOMS: FEVER, PAIN, DIFFICULTY URINATING  Low Fiber and Residue Restricted Diet A low fiber diet restricts foods that  contain carbohydrates that are not digested in the small intestine. A diet containing about 10 g of fiber is considered low fiber. The diet needs to be individualized to suit patient tolerances and preferences and to avoid unnecessary restrictions. Generally, the foods emphasized in a low fiber diet have no skins or seeds. They may have been processed to remove bran, germ, or husks. Cooking may not necessarily eliminate the fiber. Cooking may, in fact, enable a greater quantity of fiber to be consumed in a lesser volume. Legumes and nuts are also restricted. The term low residue has also been used to describe low fiber diets, although the two are not the same. Residue refers to any substance that adds to bowel (colonic) contents, such as sloughed cells and intestinal bacteria, in addition to fiber. Residue-containing foods, prunes and prune juice, milk, and connective tissue from meats may also need to be eliminated. It is important to eliminate these foods during sudden (acute) attacks of inflammatory bowel disease, when there is a partial obstruction due to another reason, or when minimal fecal output is desired. When these problems are gone, a more normal diet may be used.  PURPOSE  Reduce stool weight and volume.   CHOOSING FOODS Check labels, especially on foods from the starch list. Often, dietary fiber content is listed with the Nutrition Facts panel.  Breads and Starches  Allowed: White, [place], and pita breads, plain rolls, buns, or sweet rolls, doughnuts, waffles, pancakes, bagels. Plain muffins, sweet breads, biscuits, matzoth. Flour. Soda, saltine, or graham crackers. Pretzels, rusks, melba toast, zwieback. Cooked cereals: cornmeal, farina, cream cereals. Dry cereals: refined corn, wheat, rice, and oat cereals (check label). Potatoes prepared any way without skins, refined macaroni, spaghetti, noodles, refined rice.   Avoid: Bread, rolls, or crackers made with whole-wheat, multigrains, rye,  bran seeds, nuts, or coconut. Corn tortillas, table-shells. Corn chips, tortilla chips. Cereals containing whole-grains, multigrains, bran, coconut, nuts, or raisins. Cooked or dry oatmeal. Coarse wheat cereals, granola. Cereals advertised as "high fiber." Potato skins. Whole-grain pasta, wild or brown rice. Popcorn.  Vegetables  Allowed:  Strained tomato and vegetable juices. Fresh: tender lettuce, cucumber, cabbage, spinach, bean sprouts. Cooked, canned: asparagus, bean sprouts, cut green or wax beans, cauliflower, pumpkin, beets, mushrooms, olives, spinach, yellow squash, tomato, tomato sauce (no seeds), zucchini (peeled), turnips. Canned sweet potatoes. Small amounts of celery, onion, radish, and green pepper may be used. Keep servings limited to  cup.   Avoid: Fresh, cooked, or canned: artichokes, baked beans, beet greens, broccoli, Brussels sprouts, French-style green beans, corn, kale, legumes, peas, sweet potatoes. Cooked: green or red cabbage, spinach. Avoid large servings of any vegetables.  Fruit  Allowed:  All fruit juices except prune juice. Cooked or canned: apricots applesauce, cantaloupe, cherries, grapefruit, grapes, kiwi, mandarin oranges, peaches, pears, fruit cocktail, pineapple, plums, watermelon. Fresh: banana, grapes, cantaloupe, avocado, cherries, pineapple, grapefruit, kiwi,  nectarines, peaches, oranges, blueberries, plums. Keep servings limited to  cup or 1 piece.   Avoid: Fresh: apple with or without skin, apricots, mango, pears, raspberries, strawberries. Prune juice, stewed or dried prunes. Dried fruits, raisins, dates. Avoid large servings of all fresh fruits.  Meat and Meat Substitutes  Allowed:  Ground or well-cooked tender beef, ham, veal, lamb, pork, or poultry. Eggs, plain cheese. Fish, oysters, shrimp, lobster, other seafood. Liver, organ meats.   Avoid: Tough, fibrous meats with gristle. Peanut butter, smooth or chunky. Cheese with seeds, nuts, or other foods not  allowed. Nuts, seeds, legumes, dried peas, beans, lentils.  Milk  Allowed:  All milk products except those not allowed. Milk and milk product consumption should be minimal when low residue is desired.   Avoid: Yogurt that contains nuts or seeds.  Soups and Combination Foods  Allowed:  Bouillon, broth, or cream soups made from allowed foods. Any strained soup. Casseroles or mixed dishes made with allowed foods.   Avoid: Soups made from vegetables that are not allowed or that contain other foods not allowed.  Desserts and Sweets  Allowed:  Plain cakes and cookies, pie made with allowed fruit, pudding, custard, cream pie. Gelatin, fruit, ice, sherbet, frozen ice pops. Ice cream, ice milk without nuts. Plain hard candy, honey, jelly, molasses, syrup, sugar, chocolate syrup, gumdrops, marshmallows.   Avoid: Desserts, cookies, or candies that contain nuts, peanut butter, or dried fruits. Jams, preserves with seeds, marmalade.  Fats and Oils  Allowed:  Margarine, butter, cream, mayonnaise, salad oils, plain salad dressings made from allowed foods. Plain gravy, crisp bacon without rind.   Avoid: Seeds, nuts, olives. Avocados.  Beverages  Allowed:  All, except those listed to avoid.   Avoid: Fruit juices with high pulp, prune juice.  Condiments  Allowed:  Ketchup, mustard, horseradish, vinegar, cream sauce, cheese sauce, cocoa powder. Spices in moderation: allspice, basil, bay leaves, celery powder or leaves, cinnamon, cumin powder, curry powder, ginger, mace, marjoram, onion or garlic powder, oregano, paprika, parsley flakes, ground pepper, rosemary, sage, savory, tarragon, thyme, turmeric.   Avoid: Coconut, pickles.  SAMPLE MEAL PLAN The following menu is provided as a sample. Your daily menu plans will vary. Be sure to include a minimum of the following each day in order to provide essential nutrients for the adult:  Starch/Bread/Cereal Group, 6 servings.   Fruit/Vegetable Group, 5  servings.   Meat/Meat Substitute Group, 2 servings.   Milk/Milk Substitute Group, 2 servings.  A serving is equal to  cup for fruits, vegetables, and cooked cereals or 1 piece for foods such as a piece of bread, 1 orange, or 1 apple. For dry cereals and crackers, use serving sizes listed on the label. Combination foods may count as full or partial servings from various food groups. Fats, desserts, and sweets may be added to the meal plan after the requirements for essential nutrients are met. SAMPLE MENU Breakfast   cup orange juice.   1 boiled egg.   1 slice white toast.   Margarine.    cup cornflakes.   1 cup milk.   Beverage.  Lunch   cup chicken noodle soup.   2 to 3 oz sliced roast beef.   2 slices seedless rye bread.   Mayonnaise.    cup tomato juice.   1 small banana.   Beverage.  Dinner  3 oz baked chicken.    cup scalloped potatoes.    cup cooked beets.   White dinner roll.  Margarine.    cup canned peaches.   Beverage.

## 2013-10-29 NOTE — Progress Notes (Signed)
Pt has OV for 6/4 at 11 with SF

## 2013-10-29 NOTE — Progress Notes (Signed)
No pcp

## 2013-10-31 ENCOUNTER — Encounter (HOSPITAL_COMMUNITY): Payer: Self-pay | Admitting: Gastroenterology

## 2013-11-28 ENCOUNTER — Ambulatory Visit: Payer: Self-pay | Admitting: Gastroenterology

## 2014-01-15 ENCOUNTER — Encounter: Payer: Self-pay | Admitting: Gastroenterology

## 2014-01-15 ENCOUNTER — Encounter (INDEPENDENT_AMBULATORY_CARE_PROVIDER_SITE_OTHER): Payer: Self-pay

## 2014-01-15 ENCOUNTER — Ambulatory Visit (INDEPENDENT_AMBULATORY_CARE_PROVIDER_SITE_OTHER): Payer: BC Managed Care – PPO | Admitting: Gastroenterology

## 2014-01-15 VITALS — BP 136/86 | HR 82 | Temp 97.5°F | Ht 70.0 in | Wt 139.6 lb

## 2014-01-15 DIAGNOSIS — K602 Anal fissure, unspecified: Secondary | ICD-10-CM

## 2014-01-15 MED ORDER — NITROGLYCERIN 0.4 % RE OINT: TOPICAL_OINTMENT | RECTAL | Status: AC

## 2014-01-15 MED ORDER — HYDROCORTISONE 2.5 % RE CREA: TOPICAL_CREAM | RECTAL | Status: AC

## 2014-01-15 NOTE — Patient Instructions (Addendum)
CONTINUE NITROGLYCERIN OINTMENT FOR 3 MOS.   USE PROCTOZONE CREAM FOUR TIMES A DAY FOR 14 DAYS. APPLY TO ANUS TO RELIEVE RECTAL PAIN.  FOLLOW A LOW RESIDUE DIET FOR ONE MONTH. SEE INFO BELOW.  FOLLOW UP IN 4 MOS.    Low Fiber and Residue Restricted Diet A low fiber diet restricts foods that contain carbohydrates that are not digested in the small intestine. A diet containing about 10 g of fiber is considered low fiber. The diet needs to be individualized to suit patient tolerances and preferences and to avoid unnecessary restrictions. Generally, the foods emphasized in a low fiber diet have no skins or seeds. They may have been processed to remove bran, germ, or husks. Cooking may not necessarily eliminate the fiber. Cooking may, in fact, enable a greater quantity of fiber to be consumed in a lesser volume. Legumes and nuts are also restricted.  The term low residue has also been used to describe low fiber diets, although the two are not the same. Residue refers to any substance that adds to bowel (colonic) contents, such as sloughed cells and intestinal bacteria, in addition to fiber. Residue-containing foods, prunes and prune juice, milk, and connective tissue from meats may also need to be eliminated. It is important to eliminate these foods during sudden (acute) attacks of inflammatory bowel disease, when there is a partial obstruction due to another reason, or when minimal fecal output is desired. When these problems are gone, a more normal diet may be used.  PURPOSE  Reduce stool weight and volume.   CHOOSING FOODS Check labels, especially on foods from the starch list. Often, dietary fiber content is listed with the Nutrition Facts panel.  Breads and Starches  Allowed: White, [place], and pita breads, plain rolls, buns, or sweet rolls, doughnuts, waffles, pancakes, bagels. Plain muffins, sweet breads, biscuits, matzoth. Flour. Soda, saltine, or graham crackers. Pretzels, rusks, melba  toast, zwieback. Cooked cereals: cornmeal, farina, cream cereals. Dry cereals: refined corn, wheat, rice, and oat cereals (check label). Potatoes prepared any way without skins, refined macaroni, spaghetti, noodles, refined rice.   Avoid: Bread, rolls, or crackers made with whole-wheat, multigrains, rye, bran seeds, nuts, or coconut. Corn tortillas, table-shells. Corn chips, tortilla chips. Cereals containing whole-grains, multigrains, bran, coconut, nuts, or raisins. Cooked or dry oatmeal. Coarse wheat cereals, granola. Cereals advertised as "high fiber." Potato skins. Whole-grain pasta, wild or brown rice. Popcorn.  Vegetables  Allowed:  Strained tomato and vegetable juices. Fresh: tender lettuce, cucumber, cabbage, spinach, bean sprouts. Cooked, canned: asparagus, bean sprouts, cut green or wax beans, cauliflower, pumpkin, beets, mushrooms, olives, spinach, yellow squash, tomato, tomato sauce (no seeds), zucchini (peeled), turnips. Canned sweet potatoes. Small amounts of celery, onion, radish, and green pepper may be used. Keep servings limited to  cup.   Avoid: Fresh, cooked, or canned: artichokes, baked beans, beet greens, broccoli, Brussels sprouts, French-style green beans, corn, kale, legumes, peas, sweet potatoes. Cooked: green or red cabbage, spinach. Avoid large servings of any vegetables.  Fruit  Allowed:  All fruit juices except prune juice. Cooked or canned: apricots applesauce, cantaloupe, cherries, grapefruit, grapes, kiwi, mandarin oranges, peaches, pears, fruit cocktail, pineapple, plums, watermelon. Fresh: banana, grapes, cantaloupe, avocado, cherries, pineapple, grapefruit, kiwi, nectarines, peaches, oranges, blueberries, plums. Keep servings limited to  cup or 1 piece.   Avoid: Fresh: apple with or without skin, apricots, mango, pears, raspberries, strawberries. Prune juice, stewed or dried prunes. Dried fruits, raisins, dates. Avoid large servings of all fresh fruits.  Meat  and  Meat Substitutes  Allowed:  Ground or well-cooked tender beef, ham, veal, lamb, pork, or poultry. Eggs, plain cheese. Fish, oysters, shrimp, lobster, other seafood. Liver, organ meats.   Avoid: Tough, fibrous meats with gristle. Peanut butter, smooth or chunky. Cheese with seeds, nuts, or other foods not allowed. Nuts, seeds, legumes, dried peas, beans, lentils.  Milk  Allowed:  All milk products except those not allowed. Milk and milk product consumption should be minimal when low residue is desired.   Avoid: Yogurt that contains nuts or seeds.  Soups and Combination Foods  Allowed:  Bouillon, broth, or cream soups made from allowed foods. Any strained soup. Casseroles or mixed dishes made with allowed foods.   Avoid: Soups made from vegetables that are not allowed or that contain other foods not allowed.  Desserts and Sweets  Allowed:  Plain cakes and cookies, pie made with allowed fruit, pudding, custard, cream pie. Gelatin, fruit, ice, sherbet, frozen ice pops. Ice cream, ice milk without nuts. Plain hard candy, honey, jelly, molasses, syrup, sugar, chocolate syrup, gumdrops, marshmallows.   Avoid: Desserts, cookies, or candies that contain nuts, peanut butter, or dried fruits. Jams, preserves with seeds, marmalade.  Fats and Oils  Allowed:  Margarine, butter, cream, mayonnaise, salad oils, plain salad dressings made from allowed foods. Plain gravy, crisp bacon without rind.   Avoid: Seeds, nuts, olives. Avocados.  Beverages  Allowed:  All, except those listed to avoid.   Avoid: Fruit juices with high pulp, prune juice.  Condiments  Allowed:  Ketchup, mustard, horseradish, vinegar, cream sauce, cheese sauce, cocoa powder. Spices in moderation: allspice, basil, bay leaves, celery powder or leaves, cinnamon, cumin powder, curry powder, ginger, mace, marjoram, onion or garlic powder, oregano, paprika, parsley flakes, ground pepper, rosemary, sage, savory, tarragon, thyme, turmeric.    Avoid: Coconut, pickles.  SAMPLE MEAL PLAN The following menu is provided as a sample. Your daily menu plans will vary. Be sure to include a minimum of the following each day in order to provide essential nutrients for the adult:  Starch/Bread/Cereal Group, 6 servings.   Fruit/Vegetable Group, 5 servings.   Meat/Meat Substitute Group, 2 servings.   Milk/Milk Substitute Group, 2 servings.  A serving is equal to  cup for fruits, vegetables, and cooked cereals or 1 piece for foods such as a piece of bread, 1 orange, or 1 apple. For dry cereals and crackers, use serving sizes listed on the label. Combination foods may count as full or partial servings from various food groups. Fats, desserts, and sweets may be added to the meal plan after the requirements for essential nutrients are met. SAMPLE MENU Breakfast   cup orange juice.   1 boiled egg.   1 slice white toast.   Margarine.    cup cornflakes.   1 cup milk.   Beverage.  Lunch   cup chicken noodle soup.   2 to 3 oz sliced roast beef.   2 slices seedless rye bread.   Mayonnaise.    cup tomato juice.   1 small banana.   Beverage.  Dinner  3 oz baked chicken.    cup scalloped potatoes.    cup cooked beets.   White dinner roll.   Margarine.    cup canned peaches.   Beverage.

## 2014-01-15 NOTE — Assessment & Plan Note (Addendum)
BLEEDING CONTINUES AND RECTAL PAIN AND POSSIBLY EXACERBATED BY EXTERNAL HEMORRHOID FLARE.  ADVISED AGAINST ADDITIONAL WEIGHT LOSS DUE TO RISK OF OSTEOPOROSIS. PROCTOZONE QID FOR 14 DAYS. DISCUSSED FISSURE, AND MANAGEMENT OF FISSURE. EXPLAINED SHE HAS FAILED MEDICAL MANAGEMENT. RECOMMENDED SURGICAL MANAGEMENT. PT DECLINED SURGICAL MANAGEMENT TODAY. WOULD LIKE TO TRY DIET MANAGEMENT AND NTG OINTMENT FOR 3 MOS. FOLLOW UP IN 3 MOS.

## 2014-01-15 NOTE — Progress Notes (Signed)
   Subjective:    Patient ID: Erica Jones, female    DOB: 05/16/1956, 58 y.o.   MRN: [id number]  No PCP Per Patient  HPI  Still has pain on outside. INSIDE DID REAL WELL IN THE FIRST MO. FIRST NO-BLEEDING EVERY DAY THEN NOTHING. IF SHE EATS THINGS WITH SKINS/SEEDS, BOTTOM BLEEDS. HAD SALAD SAT AND A FEW DAYS LATER RECTAL BLEEDING. NOW HAVING PAIN ON OUTSIDE AND NOW LAST TIME JUN 4 & BLED FOR 10 DAYS AFTER EATING PEACHES AND BLUEBERRIES.   PT DENIES FEVER, CHILLS, HEMATOCHEZIA, nausea, vomiting, melena, diarrhea, CHEST PAIN, SHORTNESS OF BREATH,  CHANGE IN BOWEL IN HABITS, constipation, abdominal pain, problems swallowing, problems with sedation, heartburn or indigestion.   Past Medical History  Diagnosis Date  . Hemorrhoids   . Migraines    Past Surgical History  Procedure Laterality Date  . Hemorroidectomy      14 years ago  . Flexible sigmoidoscopy N/A 10/28/2013    Procedure: FLEXIBLE SIGMOIDOSCOPY;  Surgeon: West BaliSandi L Hammond Obeirne, MD;  Location: [facility];  Service: Endoscopy;  Laterality: N/A;  2:30  . Hemorrhoid banding N/A 10/28/2013    Procedure: HEMORRHOID BANDING;  Surgeon: West BaliSandi L Arnelle Nale, MD;  Location: [facility];  Service: Endoscopy;  Laterality: N/A;   Allergies  Allergen Reactions  . Other     NO BLOOD PRODUCTS   Current Outpatient Prescriptions  Medication Sig Dispense Refill  . psyllium (METAMUCIL) 58.6 % powder Take 1 packet by mouth once. One tablespoon once daily      . Multiple Vitamin (MULTIVITAMIN WITH MINERALS) TABS tablet Take 1 tablet by mouth daily.          Review of Systems     Objective:   Physical Exam  Vitals reviewed. Constitutional: She is oriented to person, place, and time. She appears well-developed and well-nourished. No distress.  HENT:  Head: Normocephalic and atraumatic.  Mouth/Throat: Oropharynx is clear and moist. No oropharyngeal exudate.  Eyes: Pupils are equal, round, and reactive to light. No scleral icterus.  Neck:  Normal range of motion. Neck supple.  Cardiovascular: Normal rate, regular rhythm and normal heart sounds.   Pulmonary/Chest: Effort normal and breath sounds normal. No respiratory distress.  Abdominal: Soft. Bowel sounds are normal. She exhibits no distension. There is no tenderness.  Musculoskeletal: She exhibits no edema.  Lymphadenopathy:    She has no cervical adenopathy.  Neurological: She is alert and oriented to person, place, and time.  NO FOCAL DEFICITS   Psychiatric: She has a normal mood and affect.          Assessment & Plan:

## 2014-01-21 NOTE — Progress Notes (Signed)
Reminder in epic °

## 2014-01-21 NOTE — Progress Notes (Signed)
No pcp

## 2014-03-06 ENCOUNTER — Encounter: Payer: Self-pay | Admitting: Gastroenterology

## 2016-10-12 ENCOUNTER — Ambulatory Visit: Payer: Self-pay | Admitting: Gastroenterology

## 2018-09-18 ENCOUNTER — Encounter: Payer: Self-pay | Admitting: Internal Medicine

## 2018-10-17 ENCOUNTER — Encounter: Payer: Self-pay | Admitting: Internal Medicine
